# Patient Record
Sex: Female | Born: 1955 | Race: White | Hispanic: No | Marital: Married | Smoking: Never smoker
Health system: Southern US, Community
[De-identification: ages and names within clinical notes are randomized; demographics above are authoritative.]

## PROBLEM LIST (undated history)

## (undated) DIAGNOSIS — E049 Nontoxic goiter, unspecified: Secondary | ICD-10-CM

## (undated) DIAGNOSIS — Z8619 Personal history of other infectious and parasitic diseases: Secondary | ICD-10-CM

## (undated) HISTORY — DX: Nontoxic goiter, unspecified: E04.9

## (undated) HISTORY — DX: Personal history of other infectious and parasitic diseases: Z86.19

## (undated) HISTORY — PX: OTHER SURGICAL HISTORY: SHX169

## (undated) HISTORY — PX: CATARACT EXTRACTION, BILATERAL: SHX1313

---

## 1986-12-11 HISTORY — PX: APPENDECTOMY: SHX54

## 1998-08-31 ENCOUNTER — Other Ambulatory Visit: Admission: RE | Admit: 1998-08-31 | Discharge: 1998-08-31 | Payer: Self-pay | Admitting: Obstetrics & Gynecology

## 1999-09-12 ENCOUNTER — Other Ambulatory Visit: Admission: RE | Admit: 1999-09-12 | Discharge: 1999-09-12 | Payer: Self-pay | Admitting: Obstetrics & Gynecology

## 2000-12-18 ENCOUNTER — Other Ambulatory Visit: Admission: RE | Admit: 2000-12-18 | Discharge: 2000-12-18 | Payer: Self-pay | Admitting: Obstetrics & Gynecology

## 2003-01-26 ENCOUNTER — Other Ambulatory Visit: Admission: RE | Admit: 2003-01-26 | Discharge: 2003-01-26 | Payer: Self-pay | Admitting: Obstetrics & Gynecology

## 2004-01-27 ENCOUNTER — Other Ambulatory Visit: Admission: RE | Admit: 2004-01-27 | Discharge: 2004-01-27 | Payer: Self-pay | Admitting: Obstetrics & Gynecology

## 2005-02-02 ENCOUNTER — Other Ambulatory Visit: Admission: RE | Admit: 2005-02-02 | Discharge: 2005-02-02 | Payer: Self-pay | Admitting: Obstetrics & Gynecology

## 2006-03-09 ENCOUNTER — Ambulatory Visit (HOSPITAL_COMMUNITY): Admission: RE | Admit: 2006-03-09 | Discharge: 2006-03-09 | Payer: Self-pay | Admitting: Family Medicine

## 2006-03-26 ENCOUNTER — Other Ambulatory Visit: Admission: RE | Admit: 2006-03-26 | Discharge: 2006-03-26 | Payer: Self-pay | Admitting: Obstetrics & Gynecology

## 2007-04-30 ENCOUNTER — Encounter (INDEPENDENT_AMBULATORY_CARE_PROVIDER_SITE_OTHER): Payer: Self-pay | Admitting: Obstetrics & Gynecology

## 2007-04-30 ENCOUNTER — Ambulatory Visit (HOSPITAL_COMMUNITY): Admission: RE | Admit: 2007-04-30 | Discharge: 2007-04-30 | Payer: Self-pay | Admitting: Obstetrics & Gynecology

## 2010-06-10 ENCOUNTER — Encounter: Admission: RE | Admit: 2010-06-10 | Discharge: 2010-06-10 | Payer: Self-pay | Admitting: Family Medicine

## 2011-04-28 NOTE — Op Note (Signed)
NAMEAYMARA, SASSI              ACCOUNT NO.:  [id number]   MEDICAL RECORD NO.:  [id number]          PATIENT TYPE:  AMB   LOCATION:  SDC                           FACILITY:  [facility]   PHYSICIAN:  Ilda Mori, M.D.   DATE OF BIRTH:  04/08/1956   DATE OF PROCEDURE:  04/30/2007  DATE OF DISCHARGE:                               OPERATIVE REPORT   PREOPERATIVE DIAGNOSES:  1. Submucous uterine myoma.  2. Menorrhagia.   POSTOPERATIVE DIAGNOSES:  1. Submucous uterine myoma.  2. Menorrhagia.   PROCEDURE:  Hysteroscopy, with uterine myomectomy and endometrial  ablation.   SURGEON:  Ilda Mori, M.D.   ANESTHESIA:  General endotracheal.   ESTIMATED BLOOD LOSS:  Minimal.   SPECIMENS:  Portions of uterine myoma to pathology.   FINDINGS:  A 2-cm to 3-cm submucous uterine myoma.  The endometrium  appeared otherwise normal.   INDICATIONS:  This is a 55 year old female who has had heavy vaginal  bleeding for approximately 1 to 2 years.  The patient was treated with  oral contraceptives with minimal success.  An office sonographic  histogram was performed, which revealed the presence of a 2.4-cm  submucous myoma.  The findings were discussed with the patient.  The  decision was made to proceed with hysteroscopy and uterine myomectomy.   PROCEDURE IN DETAIL:  The patient was taken to the operating room and  placed in the supine position, where general endotracheal anesthesia was  induced.  She was placed in the dorsal lithotomy position.  The vagina  and perineum were prepped and draped in sterile fashion.  Lidocaine 1%,  20 mL, was infiltrated around the cervix.  The cervix was grasped with a  single-tooth tenaculum and the internal os was dilated with Pratt  dilators to a 27.  A resectoscope was introduced into the uterus and the  submucous myoma was identified and, using the cautery loop,  the myoma was resected piecemeal.  The myoma was then totally resected  and the decision was  made to proceed with endometrial ablation.  Using  the loop on cautery, the endometrial cavity was progressively  cauterized.  The procedure was then terminated and the patient left the  operating room in good condition.      Ilda Mori, M.D.  Electronically Signed     RK/MEDQ  D:  04/30/2007  T:  04/30/2007  Job:  [id number]

## 2013-01-03 ENCOUNTER — Other Ambulatory Visit: Payer: Self-pay

## 2013-04-29 ENCOUNTER — Emergency Department (HOSPITAL_COMMUNITY): Payer: No Typology Code available for payment source

## 2013-04-29 ENCOUNTER — Emergency Department (HOSPITAL_COMMUNITY)
Admission: EM | Admit: 2013-04-29 | Discharge: 2013-04-29 | Disposition: A | Discharge status: Home / Self Care | Payer: No Typology Code available for payment source | Attending: Emergency Medicine | Admitting: Emergency Medicine

## 2013-04-29 ENCOUNTER — Encounter (HOSPITAL_COMMUNITY): Payer: Self-pay | Admitting: Cardiology

## 2013-04-29 DIAGNOSIS — H2100 Hyphema, unspecified eye: Secondary | ICD-10-CM | POA: Insufficient documentation

## 2013-04-29 DIAGNOSIS — H2102 Hyphema, left eye: Secondary | ICD-10-CM

## 2013-04-29 DIAGNOSIS — S60511A Abrasion of right hand, initial encounter: Secondary | ICD-10-CM

## 2013-04-29 DIAGNOSIS — Z882 Allergy status to sulfonamides status: Secondary | ICD-10-CM | POA: Insufficient documentation

## 2013-04-29 DIAGNOSIS — IMO0002 Reserved for concepts with insufficient information to code with codable children: Secondary | ICD-10-CM | POA: Insufficient documentation

## 2013-04-29 DIAGNOSIS — Y9389 Activity, other specified: Secondary | ICD-10-CM | POA: Insufficient documentation

## 2013-04-29 DIAGNOSIS — Y92410 Unspecified street and highway as the place of occurrence of the external cause: Secondary | ICD-10-CM | POA: Insufficient documentation

## 2013-04-29 MED ORDER — OXYCODONE-ACETAMINOPHEN 5-325 MG PO TABS: 2.0000 | ORAL_TABLET | ORAL | Status: DC | PRN

## 2013-04-29 MED ORDER — TOBRAMYCIN-DEXAMETHASONE 0.3-0.1 % OP SUSP: 1.0000 [drp] | Freq: Three times a day (TID) | OPHTHALMIC | Status: DC
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
## 2013-04-29 NOTE — ED Notes (Signed)
C-collar removed and taken off LSB by EDP
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
## 2013-09-08 ENCOUNTER — Other Ambulatory Visit: Payer: Self-pay
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
## 2017-03-15 ENCOUNTER — Encounter: Payer: Self-pay | Admitting: Family Medicine

## 2018-01-22 DIAGNOSIS — H9201 Otalgia, right ear: Secondary | ICD-10-CM | POA: Insufficient documentation

## 2018-01-22 DIAGNOSIS — M26621 Arthralgia of right temporomandibular joint: Secondary | ICD-10-CM | POA: Insufficient documentation

## 2018-02-26 ENCOUNTER — Encounter: Payer: Self-pay | Admitting: Family Medicine

## 2018-04-05 ENCOUNTER — Encounter: Payer: Self-pay | Admitting: Gastroenterology

## 2018-05-24 ENCOUNTER — Encounter: Payer: Self-pay | Admitting: Gastroenterology

## 2018-05-24 ENCOUNTER — Ambulatory Visit (AMBULATORY_SURGERY_CENTER): Payer: Self-pay | Admitting: *Deleted

## 2018-05-24 ENCOUNTER — Other Ambulatory Visit: Payer: Self-pay

## 2018-05-24 VITALS — Ht 66.0 in | Wt 145.0 lb

## 2018-05-24 DIAGNOSIS — B009 Herpesviral infection, unspecified: Secondary | ICD-10-CM | POA: Insufficient documentation

## 2018-05-24 DIAGNOSIS — Z1211 Encounter for screening for malignant neoplasm of colon: Secondary | ICD-10-CM

## 2018-05-24 MED ORDER — NA SULFATE-K SULFATE-MG SULF 17.5-3.13-1.6 GM/177ML PO SOLN: ORAL | 0 refills | Status: DC

## 2018-05-24 NOTE — Progress Notes (Signed)
Patient denies any allergies to eggs or soy. Patient denies any problems with anesthesia/sedation. Patient denies any oxygen use at home. Patient denies taking any diet/weight loss medications or blood thinners. EMMI education assisgned to patient on colonoscopy, this was explained and instructions given to patient. suprep coupon given to pt. Pt has a fear of needles!

## 2018-06-07 ENCOUNTER — Other Ambulatory Visit: Payer: Self-pay

## 2018-06-07 ENCOUNTER — Ambulatory Visit (AMBULATORY_SURGERY_CENTER): Payer: BC Managed Care – PPO | Admitting: Gastroenterology

## 2018-06-07 ENCOUNTER — Encounter: Payer: Self-pay | Admitting: Gastroenterology

## 2018-06-07 VITALS — BP 152/70 | HR 59 | Temp 96.9°F | Resp 16 | Ht 66.0 in | Wt 145.0 lb

## 2018-06-07 DIAGNOSIS — Z1211 Encounter for screening for malignant neoplasm of colon: Secondary | ICD-10-CM | POA: Diagnosis not present

## 2018-06-07 MED ORDER — SODIUM CHLORIDE 0.9 % IV SOLN: 500.0000 mL | INTRAVENOUS | Status: AC

## 2018-06-07 NOTE — Progress Notes (Signed)
To recovery, report to RN, VSS. 

## 2018-06-07 NOTE — Patient Instructions (Signed)
YOU HAD AN ENDOSCOPIC PROCEDURE TODAY AT THE [facility]:   Refer to the procedure report that was given to you for any specific questions about what was found during the examination.  If the procedure report does not answer your questions, please call your gastroenterologist to clarify.  If you requested that your care partner not be given the details of your procedure findings, then the procedure report has been included in a sealed envelope for you to review at your convenience later.  YOU SHOULD EXPECT: Some feelings of bloating in the abdomen. Passage of more gas than usual.  Walking can help get rid of the air that was put into your GI tract during the procedure and reduce the bloating. If you had a lower endoscopy (such as a colonoscopy or flexible sigmoidoscopy) you may notice spotting of blood in your stool or on the toilet paper. If you underwent a bowel prep for your procedure, you may not have a normal bowel movement for a few days.  Please Note:  You might notice some irritation and congestion in your nose or some drainage.  This is from the oxygen used during your procedure.  There is no need for concern and it should clear up in a day or so.  SYMPTOMS TO REPORT IMMEDIATELY:   Following lower endoscopy (colonoscopy or flexible sigmoidoscopy):  Excessive amounts of blood in the stool  Significant tenderness or worsening of abdominal pains  Swelling of the abdomen that is new, acute  Fever of 100F or higher   For urgent or emergent issues, a gastroenterologist can be reached at any hour by calling [phone].   DIET:  We do recommend a small meal at first, but then you may proceed to your regular diet.  Drink plenty of fluids but you should avoid alcoholic beverages for 24 hours. Try to increase the fiber in your diet, and drink plenty of water.  ACTIVITY:  You should plan to take it easy for the rest of today and you should NOT DRIVE or use heavy machinery until  tomorrow (because of the sedation medicines used during the test).    FOLLOW UP: Our staff will call the number listed on your records the next business day following your procedure to check on you and address any questions or concerns that you may have regarding the information given to you following your procedure. If we do not reach you, we will leave a message.  However, if you are feeling well and you are not experiencing any problems, there is no need to return our call.  We will assume that you have returned to your regular daily activities without incident.  If any biopsies were taken you will be contacted by phone or by letter within the next 1-3 weeks.  Please call us at [phone] if you have not heard about the biopsies in 3 weeks.    SIGNATURES/CONFIDENTIALITY: You and/or your care partner have signed paperwork which will be entered into your electronic medical record.  These signatures attest to the fact that that the information above on your After Visit Summary has been reviewed and is understood.  Full responsibility of the confidentiality of this discharge information lies with you and/or your care-partner.  Read all of the handouts given to you by your recovery room nurse. 

## 2018-06-07 NOTE — Progress Notes (Signed)
I have reviewed the patient's medical history in detail and updated the computerized patient record.

## 2018-06-07 NOTE — Op Note (Signed)
[facility] Patient Name: Emily Powell Procedure Date: 06/07/2018 8:00 AM MRN: [id number] Endoscopist: Napoleon Form , MD Age: 62 Referring MD:  Date of Birth: [birth year] Gender: Female Account #: [id number] Procedure:                Colonoscopy Indications:              Screening for colorectal malignant neoplasm, This                            is the patient's first colonoscopy Medicines:                Monitored Anesthesia Care Procedure:                Pre-Anesthesia Assessment:                           - Prior to the procedure, a History and Physical                            was performed, and patient medications and                            allergies were reviewed. The patient's tolerance of                            previous anesthesia was also reviewed. The risks                            and benefits of the procedure and the sedation                            options and risks were discussed with the patient.                            All questions were answered, and informed consent                            was obtained. Prior Anticoagulants: The patient has                            taken no previous anticoagulant or antiplatelet                            agents. ASA Grade Assessment: I - A normal, healthy                            patient. After reviewing the risks and benefits,                            the patient was deemed in satisfactory condition to                            undergo the procedure.  After obtaining informed consent, the colonoscope                            was passed under direct vision. Throughout the                            procedure, the patient's blood pressure, pulse, and                            oxygen saturations were monitored continuously. The                            Colonoscope was introduced through the anus and                            advanced to the the cecum,  identified by                            appendiceal orifice and ileocecal valve. The                            colonoscopy was performed without difficulty. The                            patient tolerated the procedure well. The quality                            of the bowel preparation was excellent. The                            ileocecal valve, appendiceal orifice, and rectum                            were photographed. Scope In: 8:04:26 AM Scope Out: 8:16:59 AM Scope Withdrawal Time: 0 hours 9 minutes 11 seconds  Total Procedure Duration: 0 hours 12 minutes 33 seconds  Findings:                 The perianal and digital rectal examinations were                            normal.                           A single small-mouthed diverticulum was found in                            the cecum. Peri-diverticular erythema was seen.                           Non-bleeding internal hemorrhoids were found during                            retroflexion. The hemorrhoids were medium-sized.  The exam was otherwise without abnormality. Complications:            No immediate complications. Estimated Blood Loss:     Estimated blood loss: none. Impression:               - Mild diverticulosis in the cecum.                            Peri-diverticular erythema was seen.                           - Non-bleeding internal hemorrhoids.                           - The examination was otherwise normal.                           - No specimens collected. Recommendation:           - Patient has a contact number available for                            emergencies. The signs and symptoms of potential                            delayed complications were discussed with the                            patient. Return to normal activities tomorrow.                            Written discharge instructions were provided to the                            patient.                            - Resume previous diet.                           - Continue present medications.                           - Repeat colonoscopy in 10 years for screening                            purposes. Napoleon Form, MD 06/07/2018 8:20:40 AM This report has been signed electronically.

## 2018-06-10 ENCOUNTER — Telehealth: Payer: Self-pay

## 2018-06-10 NOTE — Telephone Encounter (Signed)

## 2020-02-07 ENCOUNTER — Ambulatory Visit: Payer: BC Managed Care – PPO | Attending: Internal Medicine

## 2020-02-07 DIAGNOSIS — Z23 Encounter for immunization: Secondary | ICD-10-CM | POA: Insufficient documentation

## 2020-02-07 NOTE — Progress Notes (Signed)
   Covid-19 Vaccination Clinic  Name:  Quetzali Heinle    MRN: [id number] DOB: Oct 15, 1956  02/07/2020  Ms. Jambor was observed post Covid-19 immunization for 15 minutes without incidence. She was provided with Vaccine Information Sheet and instruction to access the V-Safe system.   Ms. Benbrook was instructed to call 911 with any severe reactions post vaccine: Marland Kitchen Difficulty breathing  . Swelling of your face and throat  . A fast heartbeat  . A bad rash all over your body  . Dizziness and weakness    Immunizations Administered    Name Date Dose VIS Date Route   Pfizer COVID-19 Vaccine 02/07/2020 11:44 AM 0.3 mL 11/21/2019 Intramuscular   Manufacturer: [employer], [employer]   Lot: [id number]   NDC: [address]-0

## 2020-03-03 ENCOUNTER — Ambulatory Visit: Payer: BC Managed Care – PPO

## 2020-06-01 ENCOUNTER — Ambulatory Visit (INDEPENDENT_AMBULATORY_CARE_PROVIDER_SITE_OTHER): Payer: BC Managed Care – PPO | Admitting: Ophthalmology

## 2020-06-01 ENCOUNTER — Encounter (INDEPENDENT_AMBULATORY_CARE_PROVIDER_SITE_OTHER): Payer: Self-pay | Admitting: Ophthalmology

## 2020-06-01 ENCOUNTER — Other Ambulatory Visit: Payer: Self-pay

## 2020-06-01 DIAGNOSIS — H43811 Vitreous degeneration, right eye: Secondary | ICD-10-CM | POA: Diagnosis not present

## 2020-06-01 DIAGNOSIS — H401121 Primary open-angle glaucoma, left eye, mild stage: Secondary | ICD-10-CM | POA: Diagnosis not present

## 2020-06-01 DIAGNOSIS — H35411 Lattice degeneration of retina, right eye: Secondary | ICD-10-CM | POA: Diagnosis not present

## 2020-06-01 DIAGNOSIS — Z9889 Other specified postprocedural states: Secondary | ICD-10-CM

## 2020-06-01 DIAGNOSIS — H33301 Unspecified retinal break, right eye: Secondary | ICD-10-CM | POA: Diagnosis not present

## 2020-06-01 DIAGNOSIS — H35412 Lattice degeneration of retina, left eye: Secondary | ICD-10-CM | POA: Insufficient documentation

## 2020-06-01 DIAGNOSIS — Z8669 Personal history of other diseases of the nervous system and sense organs: Secondary | ICD-10-CM

## 2020-06-01 NOTE — Progress Notes (Signed)
06/01/2020     CHIEF COMPLAINT Patient presents for Retina Follow Up   HISTORY OF PRESENT ILLNESS: Emily Powell is a 64 y.o. female who presents to the clinic today for:   HPI    Retina Follow Up    Patient presents with  Retinal Break/Detachment.  In right eye.  Severity is moderate.  Duration of 1 year.  Since onset it is stable.  I, the attending physician,  performed the HPI with the patient and updated documentation appropriately.          Comments    1 Year f\u OU. FP  Pt states vision is stable. Pt does see occasional floaters.       Last edited by Tilda Franco on 06/01/2020  3:23 PM. (History)      Referring physician: Alden Hipp, MD [address] [place],  [place]  HISTORICAL INFORMATION:   Selected notes from the MEDICAL RECORD NUMBER       CURRENT MEDICATIONS: No current outpatient medications on file. (Ophthalmic Drugs)   No current facility-administered medications for this visit. (Ophthalmic Drugs)   Current Outpatient Medications (Other)  Medication Sig  . ALPRAZolam (XANAX) 0.25 MG tablet Take 1 tablet by mouth as needed.  Marland Kitchen CALCIUM PO Take 1 tablet by mouth daily.  . Cholecalciferol (VITAMIN D3 PO) Take 1 tablet by mouth daily.  . Multiple Vitamin (MULTIVITAMIN) tablet Take 1 tablet by mouth daily.   Current Facility-Administered Medications (Other)  Medication Route  . 0.9 %  sodium chloride infusion Intravenous      REVIEW OF SYSTEMS:    ALLERGIES Allergies  Allergen Reactions  . Sulfa Antibiotics Hives  . Sulfamethoxazole-Trimethoprim Hives    PAST MEDICAL HISTORY Past Medical History:  Diagnosis Date  . Enlarged thyroid   . History of hepatitis B    Past Surgical History:  Procedure Laterality Date  . APPENDECTOMY  1988  . CATARACT EXTRACTION, BILATERAL    . retinal rupture surgery      FAMILY HISTORY Family History  Problem Relation Age of Onset  . Colon cancer Maternal Uncle        late 25's   . Esophageal cancer Neg Hx   . Stomach cancer Neg Hx     SOCIAL HISTORY Social History   Tobacco Use  . Smoking status: Never Smoker  . Smokeless tobacco: Never Used  Vaping Use  . Vaping Use: Never used  Substance Use Topics  . Alcohol use: Yes    Comment: occ/ monthly per pt  . Drug use: Never         OPHTHALMIC EXAM:  Base Eye Exam    Visual Acuity (Snellen - Linear)      Right Left   Dist [place] 20/40 20/200   Dist ph [place] 20/20 -2 20/30       Tonometry (Tonopen, 3:27 PM)      Right Left   Pressure 15 16       Pupils      Dark Light Shape React APD   Right 5 4 Round Brisk None   Left 5 4 Round Brisk None       Visual Fields (Counting fingers)      Left Right    Full Full       Neuro/Psych    Oriented x3: Yes   Mood/Affect: Normal       Dilation    Both eyes: 1.0% Mydriacyl, 2.5% Phenylephrine @ 3:27 PM  Slit Lamp and Fundus Exam    External Exam      Right Left   External Normal Normal       Slit Lamp Exam      Right Left   Lids/Lashes Normal Normal   Conjunctiva/Sclera White and quiet White and quiet   Cornea Clear Clear   Anterior Chamber Deep and quiet Deep and quiet   Iris Round and reactive Round and reactive   Lens Centered posterior chamber intraocular lens Centered posterior chamber intraocular lens   Anterior Vitreous Normal Normal       Fundus Exam      Right Left   Posterior Vitreous Normal Vitrectomized   Disc Normal Normal   C/D Ratio .35 0.35   Macula Normal Normal   Vessels Normal Normal   Periphery [place] degeneration peripherally, retinopexy peripherally, no new holes or tears. Good retinopexy 360, no retinal          IMAGING AND PROCEDURES  Imaging and Procedures for 06/01/20           ASSESSMENT/PLAN:  No problem-specific Assessment & Plan notes found for this encounter.      ICD-10-CM   1. Retinal break of right eye  H33.301 Color Fundus Photography Optos - OU - Both Eyes  2. Primary  open angle glaucoma of left eye, mild stage  H40.1121   3. 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 Posterior vitreous detachment of right eye  H43.811   5. Lattice degeneration, left  H35.412   6. H/O vitrectomy  Z98.890   7. History of retinal detachment  Z86.69     1.  No new findings of the retina.  History of retinal detachment left eye, no retinal holes or tears in the right eye she looks like she is doing normal.  2.  3.  Ophthalmic Meds Ordered this visit:  No orders of the defined types were placed in this encounter.      Return in about 1 year (around 06/01/2021) for DILATE OU, OCT.  There are no Patient Instructions on file for this visit.   Explained the diagnoses, plan, and follow up with the patient and they expressed understanding.  Patient expressed understanding of the importance of proper follow up care.   Alford Highland Woodfin Kiss M.D. Diseases & Surgery of the Retina and Vitreous [facility] 06/01/20     Abbreviations: M myopia (nearsighted); A astigmatism; H hyperopia (farsighted); P presbyopia; Mrx spectacle prescription;  CTL contact lenses; OD right eye; OS left eye; OU both eyes  XT exotropia; ET esotropia; PEK punctate epithelial keratitis; PEE punctate epithelial erosions; DES dry eye syndrome; MGD meibomian gland dysfunction; ATs artificial tears; PFAT's preservative free artificial tears; NSC nuclear sclerotic cataract; PSC posterior subcapsular cataract; ERM epi-retinal membrane; PVD posterior vitreous detachment; RD retinal detachment; DM diabetes mellitus; DR diabetic retinopathy; NPDR non-proliferative diabetic retinopathy; PDR proliferative diabetic retinopathy; CSME clinically significant macular edema; DME diabetic macular edema; dbh dot blot hemorrhages; CWS cotton wool spot; POAG primary open angle glaucoma; C/D cup-to-disc ratio; HVF humphrey visual field; GVF goldmann visual field; OCT optical coherence tomography; IOP intraocular  pressure; BRVO Branch retinal vein occlusion; CRVO central retinal vein occlusion; CRAO central retinal artery occlusion; BRAO branch retinal artery occlusion; RT retinal tear; SB scleral buckle; PPV pars plana vitrectomy; VH Vitreous hemorrhage; PRP panretinal laser photocoagulation; IVK intravitreal kenalog; VMT vitreomacular traction; MH Macular hole;  NVD neovascularization of the disc; NVE neovascularization elsewhere; AREDS age related  eye disease study; ARMD age related macular degeneration; POAG primary open angle glaucoma; EBMD epithelial/anterior basement membrane dystrophy; ACIOL anterior chamber intraocular lens; IOL intraocular lens; PCIOL posterior chamber intraocular lens; Phaco/IOL phacoemulsification with intraocular lens placement; [place] photorefractive keratectomy; LASIK laser assisted in situ keratomileusis; HTN hypertension; DM diabetes mellitus; COPD chronic obstructive pulmonary disease

## 2020-07-12 ENCOUNTER — Other Ambulatory Visit (INDEPENDENT_AMBULATORY_CARE_PROVIDER_SITE_OTHER): Payer: Self-pay | Admitting: Ophthalmology

## 2021-06-02 ENCOUNTER — Encounter (INDEPENDENT_AMBULATORY_CARE_PROVIDER_SITE_OTHER): Payer: BC Managed Care – PPO | Admitting: Ophthalmology

## 2021-07-18 ENCOUNTER — Other Ambulatory Visit: Payer: Self-pay

## 2021-07-18 ENCOUNTER — Encounter (INDEPENDENT_AMBULATORY_CARE_PROVIDER_SITE_OTHER): Payer: Self-pay | Admitting: Ophthalmology

## 2021-07-18 ENCOUNTER — Ambulatory Visit (INDEPENDENT_AMBULATORY_CARE_PROVIDER_SITE_OTHER): Payer: BC Managed Care – PPO | Admitting: Ophthalmology

## 2021-07-18 DIAGNOSIS — H35412 Lattice degeneration of retina, left eye: Secondary | ICD-10-CM | POA: Diagnosis not present

## 2021-07-18 DIAGNOSIS — H35372 Puckering of macula, left eye: Secondary | ICD-10-CM

## 2021-07-18 DIAGNOSIS — H35411 Lattice degeneration of retina, right eye: Secondary | ICD-10-CM | POA: Diagnosis not present

## 2021-07-18 DIAGNOSIS — Z9889 Other specified postprocedural states: Secondary | ICD-10-CM | POA: Diagnosis not present

## 2021-07-18 NOTE — Assessment & Plan Note (Signed)
No new breaks °

## 2021-07-18 NOTE — Assessment & Plan Note (Signed)
Good retinopexy 360

## 2021-07-18 NOTE — Progress Notes (Signed)
07/18/2021     CHIEF COMPLAINT Patient presents for No chief complaint on file.   HISTORY OF PRESENT ILLNESS: Emily Powell is a 65 y.o. female who presents to the clinic today for:     Referring physician: Marlow Baars, MD [address] [place],  [place] [address]  HISTORICAL INFORMATION:   Selected notes from the MEDICAL RECORD NUMBER       CURRENT MEDICATIONS: Current Outpatient Medications (Ophthalmic Drugs)  Medication Sig   latanoprost (XALATAN) 0.005 % ophthalmic solution INSTILL 1 DROP IN LEFT EYE EVERY NIGHT   No current facility-administered medications for this visit. (Ophthalmic Drugs)   Current Outpatient Medications (Other)  Medication Sig   ALPRAZolam (XANAX) 0.25 MG tablet Take 1 tablet by mouth as needed.   CALCIUM PO Take 1 tablet by mouth daily.   Cholecalciferol (VITAMIN D3 PO) Take 1 tablet by mouth daily.   Multiple Vitamin (MULTIVITAMIN) tablet Take 1 tablet by mouth daily.   Current Facility-Administered Medications (Other)  Medication Route   0.9 %  sodium chloride infusion Intravenous      REVIEW OF SYSTEMS:    ALLERGIES Allergies  Allergen Reactions   Sulfa Antibiotics Hives   Sulfamethoxazole-Trimethoprim Hives    PAST MEDICAL HISTORY Past Medical History:  Diagnosis Date   Enlarged thyroid    History of hepatitis B    Past Surgical History:  Procedure Laterality Date   APPENDECTOMY  1988   CATARACT EXTRACTION, BILATERAL     retinal rupture surgery      FAMILY HISTORY Family History  Problem Relation Age of Onset   Colon cancer Maternal Uncle        late 70's   Esophageal cancer Neg Hx    Stomach cancer Neg Hx     SOCIAL HISTORY Social History   Tobacco Use   Smoking status: Never   Smokeless tobacco: Never  Vaping Use   Vaping Use: Never used  Substance Use Topics   Alcohol use: Yes    Comment: occ/ monthly per pt   Drug use: Never         OPHTHALMIC EXAM:  Base Eye Exam      Visual Acuity (ETDRS)       Right Left   Dist [place] 20/30 20/100   Dist ph [place] 20/20 -1 20/40         Tonometry (Tonopen, 8:54 AM)       Right Left   Pressure 8 11         Pupils       Pupils APD   Right PERRL None   Left PERRL None         Visual Fields       Left Right    Full Full         Extraocular Movement       Right Left    Full, Ortho Full, Ortho         Neuro/Psych     Oriented x3: Yes   Mood/Affect: Normal         Dilation     Both eyes: 1.0% Mydriacyl, 2.5% Phenylephrine @ 8:54 AM           Slit Lamp and Fundus Exam     External Exam       Right Left   External Normal Normal         Slit Lamp Exam       Right Left   Lids/Lashes Normal  Normal   Conjunctiva/Sclera White and quiet White and quiet   Cornea Clear Clear   Anterior Chamber Deep and quiet Deep and quiet   Iris Round and reactive Round and reactive   Lens Centered posterior chamber intraocular lens, Open posterior capsule Centered posterior chamber intraocular lens, Open posterior capsule   Anterior Vitreous Normal Normal         Fundus Exam       Right Left   Posterior Vitreous Normal Vitrectomized   Disc Normal Normal   C/D Ratio .35 0.35   Macula Normal Epiretinal membrane minor on the temporal aspect of fovea   Vessels Normal Normal   Periphery Cobblestone degeneration peripherally at 6-7 meridians, retinopexy peripherally, no new holes or tears. Good retinopexy 360, no retinal            IMAGING AND PROCEDURES  Imaging and Procedures for 07/18/21  OCT, Retina - OU - Both Eyes       Right Eye Quality was good. Scan locations included subfoveal. Central Foveal Thickness: 293. Progression has been stable. Findings include normal foveal contour.   Left Eye Quality was borderline. Scan locations included subfoveal. Progression has worsened. Findings include epiretinal membrane.   Notes New epiretinal membrane on the temporal aspect of the  fovea yet with no foveal change and no impact on acuity will continue to observe             ASSESSMENT/PLAN:  Left epiretinal membrane New onset epiretinal membrane temporal aspect of the fovea with no foveal impact and no clear impact on acuity will continue to monitor and observe The nature of macular pucker (epiretinal membrane ERM) was discussed with the patient as well as threshold criteria for vitrectomy surgery. I explained that in rare cases another surgery is needed to actually remove a second wrinkle should it regrow.  Most often, the epiretinal membrane and underlying wrinkled internal limiting membrane are removed with the first surgery, to accomplish the goals.   If the operative eye is Phak[place] (natural lens still present), cataract surgery is often recommended prior to Vitrectomy. This will enable the retina surgeon to have the best view during surgery and the patient to obtain optimal results in the future. Treatment options were discussed.  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 Left epiretinal membrane  H35.372 OCT, Retina - OU - Both Eyes    2. H/O vitrectomy  Z98.890     3. 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  History of retinal detachment left eye, repaired.  Excellent visual acuity,   2.  Patient enjoys distance vision in the right eye and near vision in the left eye doubt spectacle correction, patient encouraged to monitor the near vision left eye to look for fact of epiretinal membrane progression.  3.  No new retinal breaks OU  Ophthalmic Meds Ordered this visit:  No orders of the defined types were placed in this encounter.      Return in about 6 months (around 01/18/2022) for dilate, OS, OCT.  There are no Patient Instructions on file for this visit.   Explained the diagnoses, plan, and  follow up with the patient and they expressed understanding.  Patient expressed understanding of the importance of proper follow up care.   Alford Highland Kyeisha Janowicz M.D.  Diseases & Surgery of the Retina and Vitreous [facility] 07/18/21     Abbreviations: M myopia (nearsighted); A astigmatism; H hyperopia (farsighted); P presbyopia; Mrx spectacle prescription;  CTL contact lenses; OD right eye; OS left eye; OU both eyes  XT exotropia; ET esotropia; PEK punctate epithelial keratitis; PEE punctate epithelial erosions; DES dry eye syndrome; MGD meibomian gland dysfunction; ATs artificial tears; PFAT's preservative free artificial tears; NSC nuclear sclerotic cataract; PSC posterior subcapsular cataract; ERM epi-retinal membrane; PVD posterior vitreous detachment; RD retinal detachment; DM diabetes mellitus; DR diabetic retinopathy; NPDR non-proliferative diabetic retinopathy; PDR proliferative diabetic retinopathy; CSME clinically significant macular edema; DME diabetic macular edema; dbh dot blot hemorrhages; CWS cotton wool spot; POAG primary open angle glaucoma; C/D cup-to-disc ratio; HVF humphrey visual field; GVF goldmann visual field; OCT optical coherence tomography; IOP intraocular pressure; BRVO Branch retinal vein occlusion; CRVO central retinal vein occlusion; CRAO central retinal artery occlusion; BRAO branch retinal artery occlusion; RT retinal tear; SB scleral buckle; PPV pars plana vitrectomy; VH Vitreous hemorrhage; PRP panretinal laser photocoagulation; IVK intravitreal kenalog; VMT vitreomacular traction; MH Macular hole;  NVD neovascularization of the disc; NVE neovascularization elsewhere; AREDS age related eye disease study; ARMD age related macular degeneration; POAG primary open angle glaucoma; EBMD epithelial/anterior basement membrane dystrophy; ACIOL anterior chamber intraocular lens; IOL intraocular lens; PCIOL posterior chamber intraocular lens; Phaco/IOL  phacoemulsification with intraocular lens placement; PRK photorefractive keratectomy; LASIK laser assisted in situ keratomileusis; HTN hypertension; DM diabetes mellitus; COPD chronic obstructive pulmonary disease

## 2021-07-18 NOTE — Assessment & Plan Note (Signed)
New onset epiretinal membrane temporal aspect of the fovea with no foveal impact and no clear impact on acuity will continue to monitor and observe The nature of macular pucker (epiretinal membrane ERM) was discussed with the patient as well as threshold criteria for vitrectomy surgery. I explained that in rare cases another surgery is needed to actually remove a second wrinkle should it regrow.  Most often, the epiretinal membrane and underlying wrinkled internal limiting membrane are removed with the first surgery, to accomplish the goals.   If the operative eye is Phakic (natural lens still present), cataract surgery is often recommended prior to Vitrectomy. This will enable the retina surgeon to have the best view during surgery and the patient to obtain optimal results in the future. Treatment options were discussed.

## 2022-01-19 ENCOUNTER — Encounter (INDEPENDENT_AMBULATORY_CARE_PROVIDER_SITE_OTHER): Payer: BC Managed Care – PPO | Admitting: Ophthalmology

## 2022-01-24 ENCOUNTER — Ambulatory Visit (INDEPENDENT_AMBULATORY_CARE_PROVIDER_SITE_OTHER): Payer: BC Managed Care – PPO | Admitting: Ophthalmology

## 2022-01-24 ENCOUNTER — Encounter (INDEPENDENT_AMBULATORY_CARE_PROVIDER_SITE_OTHER): Payer: Self-pay | Admitting: Ophthalmology

## 2022-01-24 ENCOUNTER — Other Ambulatory Visit: Payer: Self-pay

## 2022-01-24 DIAGNOSIS — H35372 Puckering of macula, left eye: Secondary | ICD-10-CM | POA: Diagnosis not present

## 2022-01-24 DIAGNOSIS — H35411 Lattice degeneration of retina, right eye: Secondary | ICD-10-CM | POA: Diagnosis not present

## 2022-01-24 DIAGNOSIS — H35413 Lattice degeneration of retina, bilateral: Secondary | ICD-10-CM | POA: Diagnosis not present

## 2022-01-24 DIAGNOSIS — H35412 Lattice degeneration of retina, left eye: Secondary | ICD-10-CM | POA: Diagnosis not present

## 2022-01-24 NOTE — Assessment & Plan Note (Signed)
No new retinal breaks 

## 2022-01-24 NOTE — Assessment & Plan Note (Signed)
Minor, seen clinically yet with no topographic distortion.  OCT discloses small changes on the superior aspect of the FAZ no impact on acuity will continue to observe no interval change

## 2022-01-24 NOTE — Progress Notes (Signed)
01/24/2022     CHIEF COMPLAINT Patient presents for  Chief Complaint  Patient presents with   Retina Follow Up   Retina Evaluation      HISTORY OF PRESENT ILLNESS: Emily Powell is a 66 y.o. female who presents to the clinic today for:   HPI   6 mos fu OS oct. Patient states vision is stable and unchanged since last visit. Denies any new floaters or FOL.  Pt is using Latanoprost OS QHS. Last edited by [name] on 01/24/2022  8:36 AM.      Referring physician: Marlow Baars, MD [address] [place],  [place] [address]  HISTORICAL INFORMATION:   Selected notes from the MEDICAL RECORD NUMBER       CURRENT MEDICATIONS: Current Outpatient Medications (Ophthalmic Drugs)  Medication Sig   latanoprost (XALATAN) 0.005 % ophthalmic solution INSTILL 1 DROP IN LEFT EYE EVERY NIGHT   No current facility-administered medications for this visit. (Ophthalmic Drugs)   Current Outpatient Medications (Other)  Medication Sig   ALPRAZolam (XANAX) 0.25 MG tablet Take 1 tablet by mouth as needed.   CALCIUM PO Take 1 tablet by mouth daily.   Cholecalciferol (VITAMIN D3 PO) Take 1 tablet by mouth daily.   Multiple Vitamin (MULTIVITAMIN) tablet Take 1 tablet by mouth daily.   Current Facility-Administered Medications (Other)  Medication Route   0.9 %  sodium chloride infusion Intravenous      REVIEW OF SYSTEMS: ROS   Negative for: Constitutional, Gastrointestinal, Neurological, Skin, Genitourinary, Musculoskeletal, HENT, Endocrine, Cardiovascular, Eyes, Respiratory, Psychiatric, Allergic/Imm, Heme/Lymph Last edited by Edmon Crape, MD on 01/24/2022  8:48 AM.       ALLERGIES Allergies  Allergen Reactions   Sulfa Antibiotics Hives   Sulfamethoxazole-Trimethoprim Hives    PAST MEDICAL HISTORY Past Medical History:  Diagnosis Date   Enlarged thyroid    History of hepatitis B    Past Surgical History:  Procedure Laterality Date   APPENDECTOMY   1988   CATARACT EXTRACTION, BILATERAL     retinal rupture surgery      FAMILY HISTORY Family History  Problem Relation Age of Onset   Colon cancer Maternal Uncle        late 70's   Esophageal cancer Neg Hx    Stomach cancer Neg Hx     SOCIAL HISTORY Social History   Tobacco Use   Smoking status: Never   Smokeless tobacco: Never  Vaping Use   Vaping Use: Never used  Substance Use Topics   Alcohol use: Yes    Comment: occ/ monthly per pt   Drug use: Never         OPHTHALMIC EXAM:  Base Eye Exam     Visual Acuity (ETDRS)       Right Left   Dist [place] 20/20 -1 20/80 -2   Dist ph [place]  20/30 -1+2         Tonometry (Tonopen, 8:38 AM)       Right Left   Pressure 13 17         Pupils       Pupils Dark Light APD   Right PERRL 5 4 None   Left PERRL 5 4 None         Extraocular Movement       Right Left    Full Full         Neuro/Psych     Oriented x3: Yes   Mood/Affect: Normal  Dilation     Left eye: 1.0% Mydriacyl, 2.5% Phenylephrine @ 8:38 AM           Slit Lamp and Fundus Exam     External Exam       Right Left   External Normal Normal         Slit Lamp Exam       Right Left   Lids/Lashes Normal Normal   Conjunctiva/Sclera White and quiet White and quiet   Cornea Clear Clear   Anterior Chamber Deep and quiet Deep and quiet   Iris Round and reactive Round and reactive   Lens Centered posterior chamber intraocular lens, Open posterior capsule Centered posterior chamber intraocular lens, Open posterior capsule   Anterior Vitreous Normal Normal         Fundus Exam       Right Left   Posterior Vitreous Normal Vitrectomized   Disc Normal Normal   C/D Ratio .35 0.35   Macula Normal Epiretinal membrane minor on the temporal aspect of fovea   Vessels Normal Normal   Periphery Cobblestone degeneration peripherally at 6-7 meridians, retinopexy peripherally, no new holes or tears. Good retinopexy 360, no retinal             IMAGING AND PROCEDURES  Imaging and Procedures for 01/24/22  OCT, Retina - OU - Both Eyes       Right Eye Quality was good. Scan locations included subfoveal. Central Foveal Thickness: 287. Progression has been stable. Findings include normal foveal contour.   Left Eye Quality was borderline. Scan locations included subfoveal. Central Foveal Thickness: 333. Progression has worsened. Findings include epiretinal membrane.   Notes New epiretinal membrane on the temporal aspect of the fovea yet with no foveal change and no impact on acuity will continue to observe             ASSESSMENT/PLAN:  Lattice degeneration, right No new retinal breaks  Lattice degeneration, left No new retinal breaks  Left epiretinal membrane Minor, seen clinically yet with no topographic distortion.  OCT discloses small changes on the superior aspect of the FAZ no impact on acuity will continue to observe no interval change     ICD-10-CM   1. Left epiretinal membrane  H35.372 OCT, Retina - OU - Both Eyes    2. 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  Patient reassured there is no interval change in the left eye.  There is no impact on acuity.  2.  Typically visual symptoms will be gradual in onset and typically with reading in the left eye alone.  3.  Ophthalmic Meds Ordered this visit:  No orders of the defined types were placed in this encounter.      Return in about 1 year (around 01/24/2023) for DILATE OU, COLOR FP, OCT.  There are no Patient Instructions on file for this visit.   Explained the diagnoses, plan, and follow up with the patient and they expressed understanding.  Patient expressed understanding of the importance of proper follow up care.   Alford Highland Sherisse Fullilove M.D. Diseases & Surgery of the Retina and Vitreous [facility] 01/24/22     Abbreviations: M myopia (nearsighted); A astigmatism;  H hyperopia (farsighted); P presbyopia; Mrx spectacle prescription;  CTL contact lenses; OD right eye; OS left eye; OU both eyes  XT exotropia; ET esotropia; PEK punctate epithelial keratitis; PEE punctate epithelial  erosions; DES dry eye syndrome; MGD meibomian gland dysfunction; ATs artificial tears; PFAT's preservative free artificial tears; NSC nuclear sclerotic cataract; PSC posterior subcapsular cataract; ERM epi-retinal membrane; PVD posterior vitreous detachment; RD retinal detachment; DM diabetes mellitus; DR diabetic retinopathy; NPDR non-proliferative diabetic retinopathy; PDR proliferative diabetic retinopathy; CSME clinically significant macular edema; DME diabetic macular edema; dbh dot blot hemorrhages; CWS cotton wool spot; POAG primary open angle glaucoma; C/D cup-to-disc ratio; HVF humphrey visual field; GVF goldmann visual field; OCT optical coherence tomography; IOP intraocular pressure; BRVO Branch retinal vein occlusion; CRVO central retinal vein occlusion; CRAO central retinal artery occlusion; BRAO branch retinal artery occlusion; RT retinal tear; SB scleral buckle; PPV pars plana vitrectomy; VH Vitreous hemorrhage; PRP panretinal laser photocoagulation; IVK intravitreal kenalog; VMT vitreomacular traction; MH Macular hole;  NVD neovascularization of the disc; NVE neovascularization elsewhere; AREDS age related eye disease study; ARMD age related macular degeneration; POAG primary open angle glaucoma; EBMD epithelial/anterior basement membrane dystrophy; ACIOL anterior chamber intraocular lens; IOL intraocular lens; PCIOL posterior chamber intraocular lens; Phaco/IOL phacoemulsification with intraocular lens placement; PRK photorefractive keratectomy; LASIK laser assisted in situ keratomileusis; HTN hypertension; DM diabetes mellitus; COPD chronic obstructive pulmonary disease

## 2022-06-17 ENCOUNTER — Other Ambulatory Visit (INDEPENDENT_AMBULATORY_CARE_PROVIDER_SITE_OTHER): Payer: Self-pay | Admitting: Ophthalmology

## 2022-10-24 ENCOUNTER — Other Ambulatory Visit: Payer: Self-pay | Admitting: Family Medicine

## 2022-10-24 ENCOUNTER — Ambulatory Visit: Payer: Self-pay

## 2022-10-24 DIAGNOSIS — R52 Pain, unspecified: Secondary | ICD-10-CM

## 2023-01-25 ENCOUNTER — Encounter (INDEPENDENT_AMBULATORY_CARE_PROVIDER_SITE_OTHER): Payer: BC Managed Care – PPO | Admitting: Ophthalmology

## 2023-08-16 ENCOUNTER — Other Ambulatory Visit: Payer: Self-pay | Admitting: Family Medicine

## 2023-08-16 DIAGNOSIS — E559 Vitamin D deficiency, unspecified: Secondary | ICD-10-CM

## 2024-02-21 ENCOUNTER — Ambulatory Visit
Admission: RE | Admit: 2024-02-21 | Discharge: 2024-02-21 | Disposition: A | Discharge status: Home / Self Care | Payer: BC Managed Care – PPO | Source: Ambulatory Visit | Attending: Family Medicine | Admitting: Family Medicine

## 2024-02-21 DIAGNOSIS — E559 Vitamin D deficiency, unspecified: Secondary | ICD-10-CM
